# Patient Record
Sex: Male | Born: 1995 | Race: Black or African American | Hispanic: No | Marital: Single | Smoking: Former smoker
Health system: Southern US, Community
[De-identification: ages and names within clinical notes are randomized; demographics above are authoritative.]

## PROBLEM LIST (undated history)

## (undated) DIAGNOSIS — J45909 Unspecified asthma, uncomplicated: Secondary | ICD-10-CM

## (undated) HISTORY — PX: APPENDECTOMY: SHX54

---
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
## 2017-03-24 ENCOUNTER — Encounter: Payer: Self-pay | Admitting: *Deleted

## 2017-03-24 ENCOUNTER — Ambulatory Visit
Admission: EM | Admit: 2017-03-24 | Discharge: 2017-03-24 | Disposition: A | Discharge status: Home / Self Care | Payer: Self-pay | Attending: Family Medicine | Admitting: Family Medicine

## 2017-03-24 ENCOUNTER — Ambulatory Visit (INDEPENDENT_AMBULATORY_CARE_PROVIDER_SITE_OTHER): Payer: Self-pay

## 2017-03-24 DIAGNOSIS — M545 Low back pain: Secondary | ICD-10-CM

## 2017-03-24 DIAGNOSIS — S39012A Strain of muscle, fascia and tendon of lower back, initial encounter: Secondary | ICD-10-CM

## 2017-03-24 HISTORY — DX: Unspecified asthma, uncomplicated: J45.909

## 2017-03-24 MED ORDER — IBUPROFEN 800 MG PO TABS
800.0000 mg | ORAL_TABLET | Freq: Once | ORAL | Status: AC
  Administered 2017-03-24: 800 mg via ORAL

## 2017-03-24 MED ORDER — IBUPROFEN 600 MG PO TABS: 600.0000 mg | ORAL_TABLET | Freq: Three times a day (TID) | ORAL | 0 refills | Status: AC | PRN

## 2017-03-24 MED ORDER — CYCLOBENZAPRINE HCL 10 MG PO TABS: 10.0000 mg | ORAL_TABLET | Freq: Two times a day (BID) | ORAL | 0 refills | Status: AC | PRN
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
## 2017-03-24 NOTE — Discharge Instructions (Signed)
Your xray was negative, you will be sore for several days. Take meds as directed. Follow up with PCP for recheck in 1 week. Go to er for new or worsening issues or concerns.
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
## 2020-05-22 ENCOUNTER — Emergency Department
Admission: EM | Admit: 2020-05-22 | Discharge: 2020-05-22 | Disposition: A | Discharge status: Home / Self Care | Payer: Self-pay | Attending: Emergency Medicine | Admitting: Emergency Medicine

## 2020-05-22 ENCOUNTER — Encounter: Payer: Self-pay | Admitting: *Deleted

## 2020-05-22 ENCOUNTER — Other Ambulatory Visit: Payer: Self-pay

## 2020-05-22 DIAGNOSIS — Z5321 Procedure and treatment not carried out due to patient leaving prior to being seen by health care provider: Secondary | ICD-10-CM | POA: Insufficient documentation

## 2020-05-22 DIAGNOSIS — R519 Headache, unspecified: Secondary | ICD-10-CM | POA: Insufficient documentation

## 2020-05-22 DIAGNOSIS — R569 Unspecified convulsions: Secondary | ICD-10-CM | POA: Insufficient documentation

## 2020-05-22 LAB — COMPREHENSIVE METABOLIC PANEL
ALT: 15 U/L (ref 0–44)
AST: 20 U/L (ref 15–41)
Albumin: 4.4 g/dL (ref 3.5–5.0)
Alkaline Phosphatase: 63 U/L (ref 38–126)
Anion gap: 7 (ref 5–15)
BUN: 14 mg/dL (ref 6–20)
CO2: 30 mmol/L (ref 22–32)
Calcium: 9.2 mg/dL (ref 8.9–10.3)
Chloride: 103 mmol/L (ref 98–111)
Creatinine, Ser: 0.81 mg/dL (ref 0.61–1.24)
GFR, Estimated: >60 mL/min (ref >60)
Glucose, Bld: 72 mg/dL (ref 70–99)
Potassium: 3.8 mmol/L (ref 3.5–5.1)
Sodium: 140 mmol/L (ref 135–145)
Total Bilirubin: 0.8 mg/dL (ref 0.3–1.2)
Total Protein: 7.8 g/dL (ref 6.5–8.1)

## 2020-05-22 LAB — CBC
HCT: 42 % (ref 39.0–52.0)
Hemoglobin: 13.5 g/dL (ref 13.0–17.0)
MCH: 27.1 pg (ref 26.0–34.0)
MCHC: 32.1 g/dL (ref 30.0–36.0)
MCV: 84.3 fL (ref 80.0–100.0)
Platelets: 240 10*3/uL (ref 150–400)
RBC: 4.98 MIL/uL (ref 4.22–5.81)
RDW: 12.9 % (ref 11.5–15.5)
WBC: 3 10*3/uL — ABNORMAL LOW (ref 4.0–10.5)
nRBC: 0 % (ref 0.0–0.2)

## 2020-05-22 NOTE — ED Triage Notes (Signed)
Pt states he was waking up this am and began twitching.  Pt was concerned he was having a seizure.  Slight headache today.  No n/v/  No hx seizure.  No medicines.  Pt alert  Speech clear.

## 2020-05-22 NOTE — ED Notes (Signed)
No answer when called from lobby 

## 2020-05-24 ENCOUNTER — Other Ambulatory Visit: Payer: Self-pay

## 2020-05-24 ENCOUNTER — Encounter: Payer: Self-pay | Admitting: Emergency Medicine

## 2020-05-24 DIAGNOSIS — G253 Myoclonus: Secondary | ICD-10-CM | POA: Insufficient documentation

## 2020-05-24 DIAGNOSIS — J45909 Unspecified asthma, uncomplicated: Secondary | ICD-10-CM | POA: Insufficient documentation

## 2020-05-24 DIAGNOSIS — Z87891 Personal history of nicotine dependence: Secondary | ICD-10-CM | POA: Insufficient documentation

## 2020-05-24 LAB — BASIC METABOLIC PANEL
Anion gap: 6 (ref 5–15)
BUN: 18 mg/dL (ref 6–20)
CO2: 28 mmol/L (ref 22–32)
Calcium: 9.2 mg/dL (ref 8.9–10.3)
Chloride: 106 mmol/L (ref 98–111)
Creatinine, Ser: 0.83 mg/dL (ref 0.61–1.24)
GFR, Estimated: >60 mL/min (ref >60)
Glucose, Bld: 82 mg/dL (ref 70–99)
Potassium: 4 mmol/L (ref 3.5–5.1)
Sodium: 140 mmol/L (ref 135–145)

## 2020-05-24 LAB — CBC
HCT: 41 % (ref 39.0–52.0)
Hemoglobin: 13.2 g/dL (ref 13.0–17.0)
MCH: 27.2 pg (ref 26.0–34.0)
MCHC: 32.2 g/dL (ref 30.0–36.0)
MCV: 84.4 fL (ref 80.0–100.0)
Platelets: 243 10*3/uL (ref 150–400)
RBC: 4.86 MIL/uL (ref 4.22–5.81)
RDW: 13 % (ref 11.5–15.5)
WBC: 3.1 10*3/uL — ABNORMAL LOW (ref 4.0–10.5)
nRBC: 0 % (ref 0.0–0.2)

## 2020-05-24 NOTE — ED Triage Notes (Signed)
Patient states that two nights ago he woke up and his "body convulsed twice". Patient states that since then he has had headaches and has been dizzy.

## 2020-05-24 NOTE — ED Notes (Signed)
Pt unable to give urine sample; pt has specimen cup.

## 2020-05-25 ENCOUNTER — Emergency Department
Admission: EM | Admit: 2020-05-25 | Discharge: 2020-05-25 | Disposition: A | Discharge status: Home / Self Care | Payer: Self-pay | Attending: Emergency Medicine | Admitting: Emergency Medicine

## 2020-05-25 ENCOUNTER — Emergency Department: Payer: Self-pay

## 2020-05-25 DIAGNOSIS — G253 Myoclonus: Secondary | ICD-10-CM

## 2020-05-25 NOTE — ED Notes (Signed)
Signature pad not available, pt verbalizes understanding of d/c instructions, denies questions or concerns 

## 2020-05-25 NOTE — ED Triage Notes (Signed)
Pt called from [facility] to treatment room, no response 

## 2020-05-25 NOTE — ED Provider Notes (Signed)
[facility] Emergency Department Provider Note   ____________________________________________    I have reviewed the triage vital signs and the nursing notes.   HISTORY  Chief Complaint Headache and Dizziness     HPI Joseph Mason. is a 24 y.o. male who reports that 2 days ago he woke up in the middle of night with a jerking motion, he is not sure if he was still draining or what happened.  But since then he has had a twitching in his right eye and mild intermittent headache so he was concerned and decided to come in to be evaluated today.  Currently feels well and has no complaints.  No further myoclonus reported.  No history of seizures.  Overall feels well, no fevers chills or cough  Past Medical History:  Diagnosis Date  . Asthma     There are no problems to display for this patient.   Past Surgical History:  Procedure Laterality Date  . APPENDECTOMY      Prior to Admission medications   Medication Sig Start Date End Date Taking? Authorizing Provider  cyclobenzaprine (FLEXERIL) 10 MG tablet Take 1 tablet (10 mg total) by mouth 2 (two) times daily as needed for muscle spasms. 03/24/17   Defelice, Para March, NP  ibuprofen (ADVIL,MOTRIN) 600 MG tablet Take 1 tablet (600 mg total) by mouth every 8 (eight) hours as needed for moderate pain. 03/24/17   Defelice, Para March, NP     Allergies Shrimp [shellfish allergy]  Family History  Problem Relation Age of Onset  . Cancer Mother   . Diabetes Father     Social History Social History   Tobacco Use  . Smoking status: Former Games developer  . Smokeless tobacco: Never Used  Vaping Use  . Vaping Use: Some days  Substance Use Topics  . Alcohol use: No    Alcohol/week: 0.0 standard drinks  . Drug use: No    Review of Systems  Constitutional: No fever/chills Eyes: No visual changes.  ENT: No sore throat. Cardiovascular: Denies chest pain. Respiratory: Denies shortness of  breath. Gastrointestinal: No abdominal pain.  No nausea, no vomiting.   Genitourinary: Negative for dysuria. Musculoskeletal: Negative for back pain. Skin: Negative for rash. Neurological: As   ____________________________________________   PHYSICAL EXAM:  VITAL SIGNS: ED Triage Vitals  Enc Vitals Group     BP 05/24/20 2308 (!) 119/58     Pulse Rate 05/24/20 2308 65     Resp 05/24/20 2308 18     Temp 05/24/20 2307 98.4 F (36.9 C)     Temp Source 05/24/20 2307 Oral     SpO2 05/24/20 2308 99 %     Weight 05/24/20 2307 72.6 kg (160 lb)     Height 05/24/20 2307 1.778 m (5\' 10" )     Head Circumference --      Peak Flow --      Pain Score 05/24/20 2307 6     Pain Loc --      Pain Edu? --      Excl. in GC? --     Constitutional: Alert and oriented.  Eyes: PERRLA, EOMI Nose: No congestion/rhinnorhea. Mouth/Throat: Mucous membranes are moist.   Neck:  Painless ROM Cardiovascular: Normal rate, regular rhythm. Grossly normal heart sounds.  Good peripheral circulation. Respiratory: Normal respiratory effort.  No retractions. Gastrointestinal: Soft and nontender. No distention.    Musculoskeletal:   Warm and well perfused Neurologic:  Normal speech and language. No gross focal neurologic  deficits are appreciated.  Cranial nerves II to XII are normal Skin:  Skin is warm, dry and intact. No rash noted. Psychiatric: Mood and affect are normal. Speech and behavior are normal.  ____________________________________________   LABS (all labs ordered are listed, but only abnormal results are displayed)  Labs Reviewed  CBC - Abnormal; Notable for the following components:      Result Value   WBC 3.1 (*)    All other components within normal limits  BASIC METABOLIC PANEL  URINALYSIS, COMPLETE (UACMP) WITH MICROSCOPIC  URINE DRUG SCREEN, QUALITATIVE ([facility] ONLY)  CBG MONITORING, ED   ____________________________________________  EKG  ED ECG REPORT I, Jene Every, the  attending physician, personally viewed and interpreted this ECG.  Date: 05/25/2020  Rhythm: normal sinus rhythm QRS Axis: normal Intervals: normal ST/T Wave abnormalities: normal Narrative Interpretation: no evidence of acute ischemia  ____________________________________________  RADIOLOGY  CT head reviewed by me, unremarkable ____________________________________________   PROCEDURES  Procedure(s) performed: No  Procedures   Critical Care performed: No ____________________________________________   INITIAL IMPRESSION / ASSESSMENT AND PLAN / ED COURSE  Pertinent labs & imaging results that were available during my care of the patient were reviewed by me and considered in my medical decision making (see chart for details).  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  Asymptomatic since then besides mild right eye twitching, normal neuro exam, labs unremarkable, CT head reassuring, appropriate for discharge with outpatient follow-up as needed.    ____________________________________________   FINAL CLINICAL IMPRESSION(S) / ED DIAGNOSES  Final diagnoses:  Myoclonic jerking while sleeping        Note:  This document was prepared using Dragon voice recognition software and may include unintentional dictation errors.   Jene Every, MD 05/25/20 [id number]

## 2020-05-25 NOTE — ED Notes (Signed)
Patient transported to CT 

## 2022-04-28 IMAGING — CT CT HEAD W/O CM
4 series · 16 of 47 positions shown, 18 images · non-contrast
Comparison: None.

CLINICAL DATA: Mental status changes

EXAM:
CT HEAD WITHOUT CONTRAST
TECHNIQUE: Contiguous axial images were obtained from the base of the skull
through the vertex without intravenous contrast.

[Series 2: head bone · axial · 0.44mm/px · z∈[+437,+467]mm · 3 of 77 slices shown]
[im 8/77  bone]
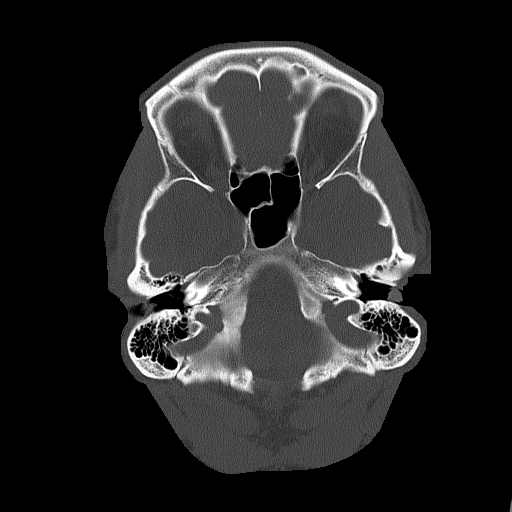
[im 16/77  bone]
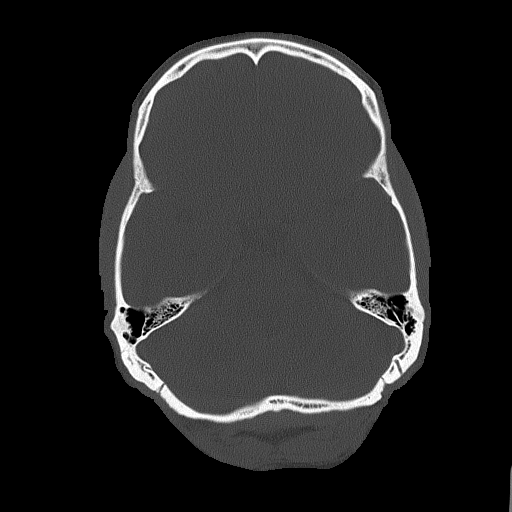
[im 23/77  bone]
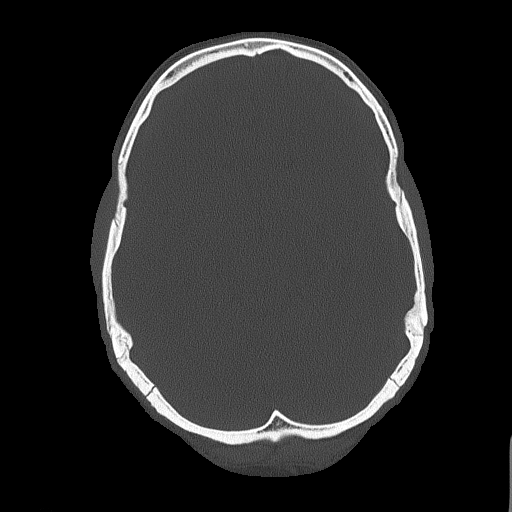

[Series 3: head wo · axial · 0.44mm/px · z∈[+438,+553]mm · 7 of 31 slices shown, 9 images]
[im 4/31  brain]
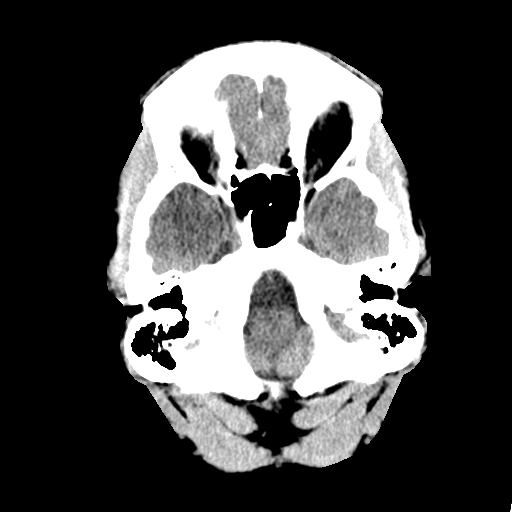
[im 4/31  bone]
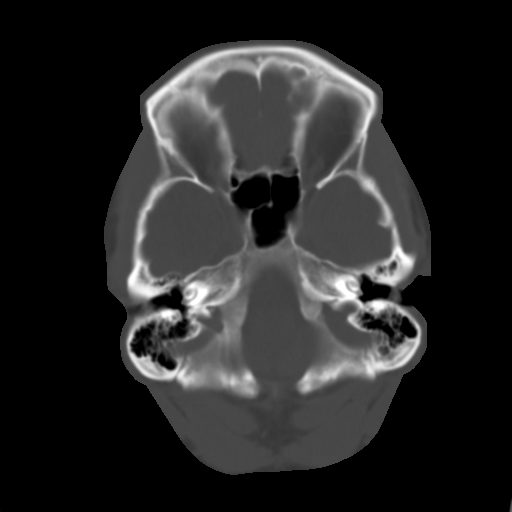
[im 8/31  brain]
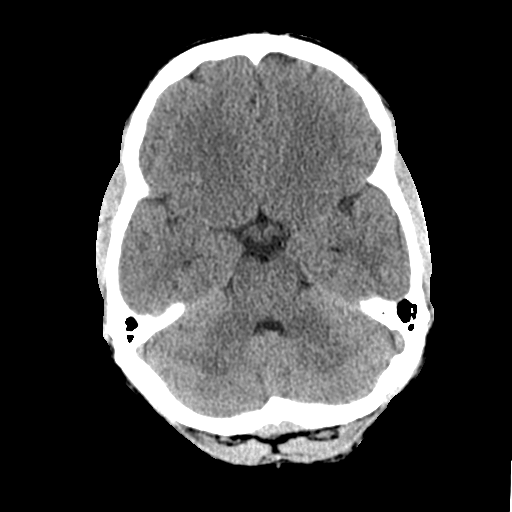
[im 12/31  brain]
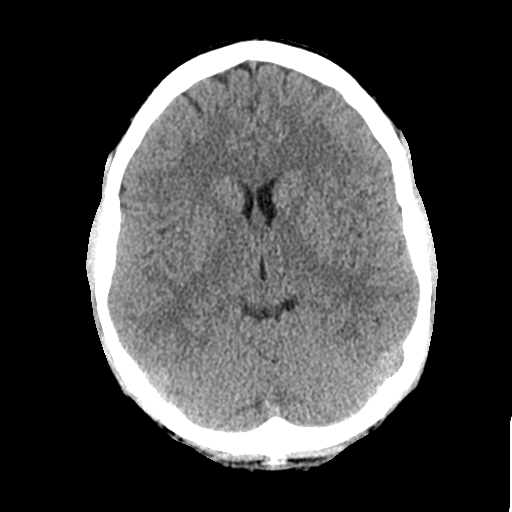
[im 16/31  brain]
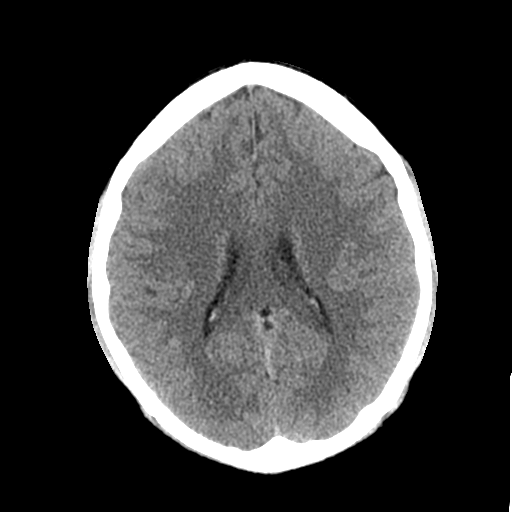
[im 19/31  brain]
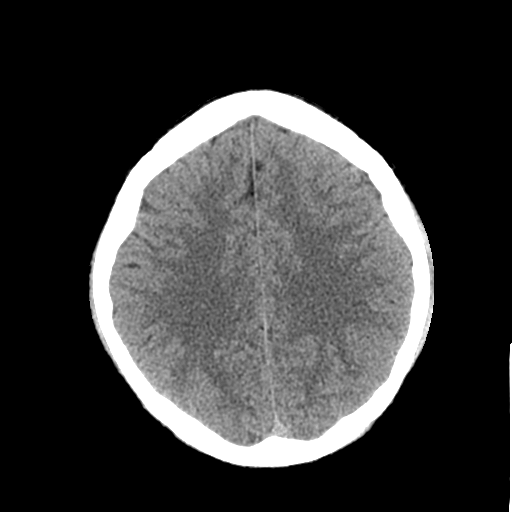
[im 19/31  bone]
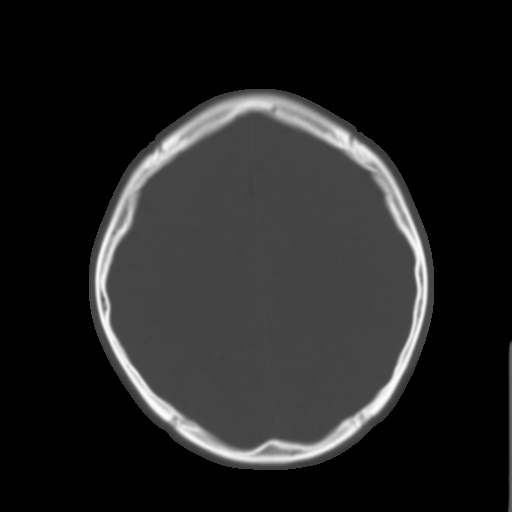
[im 23/31  brain]
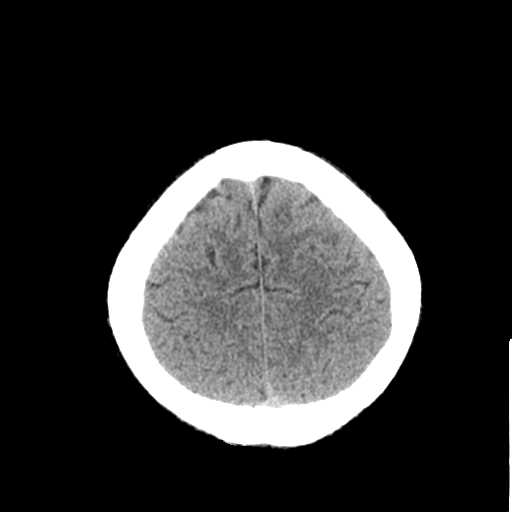
[im 27/31  brain]
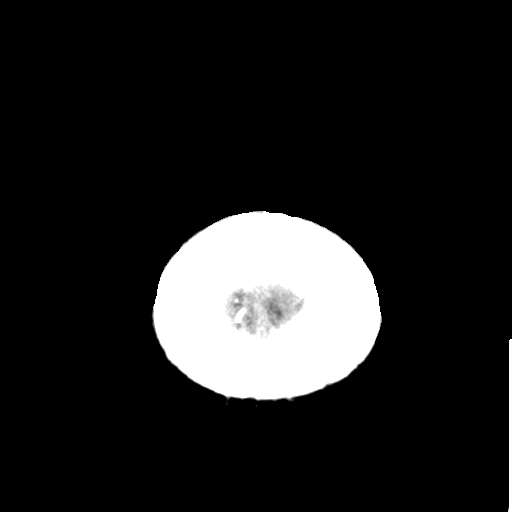

[Series 4: coronal soft tissue · coronal · 0.29mm/px · 3 of 67 slices shown]
[im 23/67  brain]
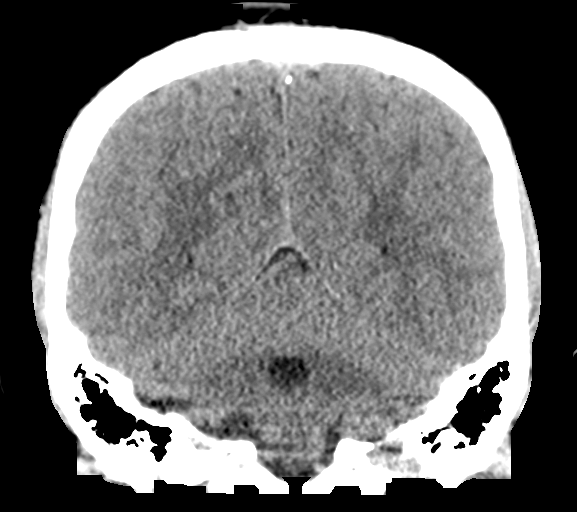
[im 30/67  brain]
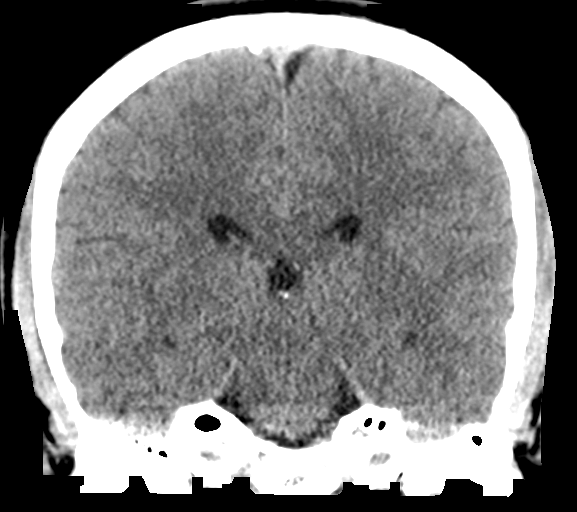
[im 37/67  brain]
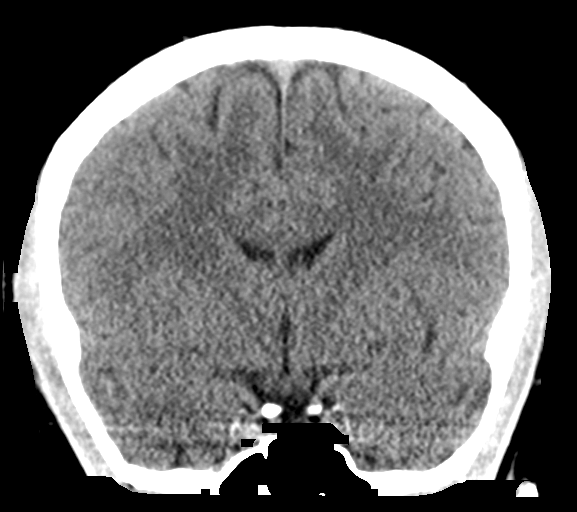

[Series 5: sagittal soft tissue · sagittal · 0.29mm/px · 3 of 56 slices shown]
[im 19/56  brain]
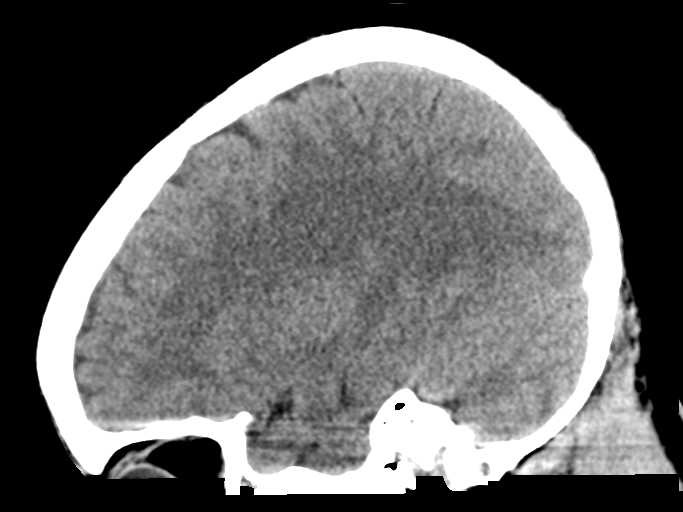
[im 28/56  brain]
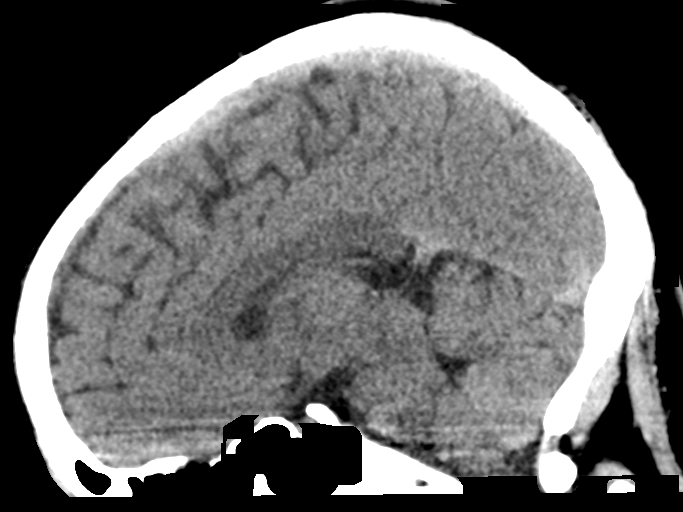
[im 37/56  brain]
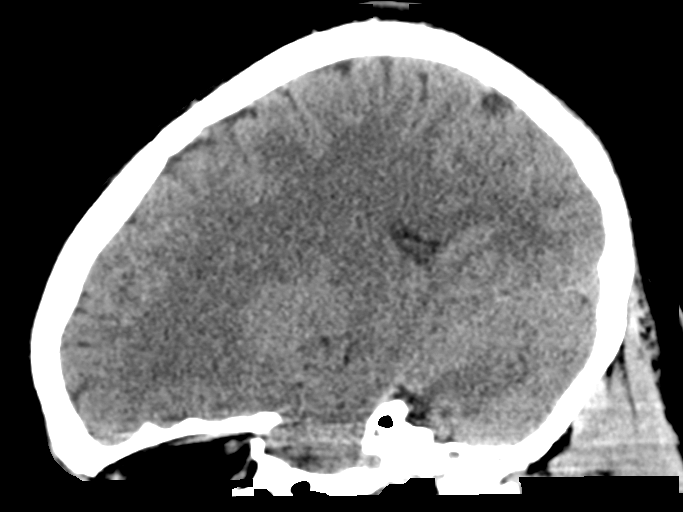

[16 of 47 positions shown; findings below may reference images not displayed]

FINDINGS: Brain: No acute intracranial abnormality. Specifically, no
hemorrhage, hydrocephalus, mass lesion, acute infarction, or
significant intracranial injury.

Vascular: No hyperdense vessel or unexpected calcification.

Skull: No acute calvarial abnormality.

Sinuses/Orbits: Visualized paranasal sinuses and mastoids clear.
Orbital soft tissues unremarkable.

Other: None
IMPRESSION: Normal study

## 2023-03-19 ENCOUNTER — Emergency Department (HOSPITAL_COMMUNITY): Payer: Self-pay

## 2023-03-19 ENCOUNTER — Emergency Department (HOSPITAL_COMMUNITY)
Admission: EM | Admit: 2023-03-19 | Discharge: 2023-03-19 | Disposition: A | Discharge status: Home / Self Care | Payer: Self-pay | Attending: Emergency Medicine | Admitting: Emergency Medicine

## 2023-03-19 ENCOUNTER — Other Ambulatory Visit: Payer: Self-pay

## 2023-03-19 ENCOUNTER — Encounter (HOSPITAL_COMMUNITY): Payer: Self-pay

## 2023-03-19 DIAGNOSIS — S62641A Nondisplaced fracture of proximal phalanx of left index finger, initial encounter for closed fracture: Secondary | ICD-10-CM | POA: Insufficient documentation

## 2023-03-19 DIAGNOSIS — Y990 Civilian activity done for income or pay: Secondary | ICD-10-CM | POA: Insufficient documentation

## 2023-03-19 DIAGNOSIS — S62643A Nondisplaced fracture of proximal phalanx of left middle finger, initial encounter for closed fracture: Secondary | ICD-10-CM | POA: Insufficient documentation

## 2023-03-19 DIAGNOSIS — W232XXA Caught, crushed, jammed or pinched between a moving and stationary object, initial encounter: Secondary | ICD-10-CM | POA: Insufficient documentation

## 2023-03-19 NOTE — ED Provider Notes (Signed)
[place] EMERGENCY DEPARTMENT AT [facility] Provider Note   CSN: [id number] Arrival date & time: 03/19/23  1313     History  No chief complaint on file.   Lars Masson. is a 27 y.o. male.  HPI 27 year old male with no significant past medical history presenting for evaluation of left-sided hand pain.  Patient works for [employer] and was lifting the sliding back door to his truck when his left hand got caught between the door and the roof.  He is complaining of numbing sensations on the palmar surface of his second and third digits on the left side.  He also reports decreased range of motion with minimal ability to flex the fingers themselves.  Does not take any medications everyday.  No known allergies.     Home Medications Prior to Admission medications   Medication Sig Start Date End Date Taking? Authorizing Provider  cyclobenzaprine (FLEXERIL) 10 MG tablet Take 1 tablet (10 mg total) by mouth 2 (two) times daily as needed for muscle spasms. 03/24/17   Defelice, Para March, NP  ibuprofen (ADVIL,MOTRIN) 600 MG tablet Take 1 tablet (600 mg total) by mouth every 8 (eight) hours as needed for moderate pain. 03/24/17   Defelice, Para March, NP      Allergies    Shrimp [shellfish allergy]    Review of Systems   Review of Systems  Physical Exam Updated Vital Signs BP 128/86 (BP Location: Right Arm)   Pulse 79   Temp 98.6 F (37 C) (Oral)   Resp 16   Ht 5\' 10"  (1.778 m)   Wt 72.6 kg   SpO2 100%   BMI 22.97 kg/m  Physical Exam Constitutional:      General: He is not in acute distress.    Appearance: He is not ill-appearing.  HENT:     Head: Normocephalic.     Right Ear: External ear normal.     Left Ear: External ear normal.     Nose: Nose normal.     Mouth/Throat:     Mouth: Mucous membranes are moist.  Eyes:     Extraocular Movements: Extraocular movements intact.     Conjunctiva/sclera: Conjunctivae normal.  Cardiovascular:     Rate and Rhythm:  Normal rate and regular rhythm.     Pulses: Normal pulses.  Pulmonary:     Effort: Pulmonary effort is normal. No respiratory distress.  Abdominal:     General: Abdomen is flat. There is no distension.  Musculoskeletal:        General: Tenderness present.     Comments: Tenderness to palpation and resistance to flexion and extension of left second and third digits at proximal phalanx.  Mild overlying edema  Skin:    General: Skin is warm and dry.     Findings: No rash.  Neurological:     Mental Status: He is alert.     Sensory: Sensory deficit present.     Comments: Subjective decreased light touch sensation to palmar surface of left second and third digits.  Sensation intact throughout the remainder of his left hand and palm     ED Results / Procedures / Treatments   Labs (all labs ordered are listed, but only abnormal results are displayed) Labs Reviewed - No data to display  EKG None  Radiology DG Hand Complete Left  Result Date: 03/19/2023 CLINICAL DATA:  Pain after injury. EXAM: LEFT HAND - COMPLETE 3+ VIEW COMPARISON:  Remote radiograph 12/13/2006 reviewed FINDINGS: Transverse nondisplaced fractures of  the second and third proximal phalanges. No intra-articular involvement. The alignment is normal, no dislocation. There is subcutaneous edema overlying the volar soft tissues. No radiopaque foreign body IMPRESSION: Transverse nondisplaced fractures of the second and third proximal phalanges. Electronically Signed   By: Narda Rutherford M.D.   On: 03/19/2023 14:56    Procedures Procedures    Medications Ordered in ED Medications - No data to display  ED Course/ Medical Decision Making/ A&P                                 Medical Decision Making Amount and/or Complexity of Data Reviewed Radiology: ordered.   27 year old male past medical history and HPI as above.  Vital signs stable upon arrival.  My examination shows decreased light touch sensation over the left  second and third digits on the palmar surface as well as decreased flexion and extension of these digits at the PIP joint.  No overlying lacerations or abrasions visualized.  X-rays of the left hand were obtained and show transverse nondisplaced fractures of the second and third proximal phalanges.  This would correlate with his tenderness and decreased range of motion on examination.  Not an open fracture.  Patient will be discharged home and finger splint with buddy tape for the second and third digits for stabilization.  Ambulatory referral to hand surgery was placed.  Patient is understanding that he will need to follow-up with for continued management.  Strict return precautions provided.    Final Clinical Impression(s) / ED Diagnoses Final diagnoses:  Closed nondisplaced fracture of proximal phalanx of left index finger, initial encounter  Closed nondisplaced fracture of proximal phalanx of left middle finger, initial encounter    Rx / DC Orders ED Discharge Orders          Ordered    Apply finger splint static       Comments: Can buddy tape 2nd and 3rd digits   03/19/23 1648    Ambulatory referral to Hand Surgery        03/19/23 1648              Lyman Speller, MD 03/19/23 1707    Cathren Laine, MD 03/22/23 1542

## 2023-03-19 NOTE — Discharge Instructions (Addendum)
Joseph Mason.:  Thank you for allowing [place] to take care of you today.  We hope you begin feeling better soon. You were seen today for left hand pain, or you have fractures of your index and middle finger.  To-Do: Please follow-up with your primary doctor to schedule an appointment with a new primary care doctor within the next 2-3 days. Please continue to wear the finger splint until seen by the hand surgery team.  A referral has been placed for an appointment.  Please return to the Emergency Department or call 911 if you experience chest pain, shortness of breath, severe pain, severe fever, altered mental status, or have any reason to think that you need emergency medical care.  Thank you again.  Hope you feel better soon.

## 2023-03-19 NOTE — ED Triage Notes (Signed)
Pt was at work and pull the back door of the truck up and it fell down on fingers of left hand. Pt c/o pain in finger. Pt has small lac on posterior of left second finger. Pt states unable to straighten fingers out.
